# Patient Record
Sex: Female | Born: 1995 | Race: White | Hispanic: No | Marital: Single | Smoking: Never smoker
Health system: Southern US, Community
[De-identification: ages and names within clinical notes are randomized; demographics above are authoritative.]

## PROBLEM LIST (undated history)

## (undated) DIAGNOSIS — N946 Dysmenorrhea, unspecified: Secondary | ICD-10-CM

## (undated) DIAGNOSIS — R011 Cardiac murmur, unspecified: Secondary | ICD-10-CM

## (undated) DIAGNOSIS — N920 Excessive and frequent menstruation with regular cycle: Secondary | ICD-10-CM

## (undated) HISTORY — PX: TYMPANOSTOMY TUBE PLACEMENT: SHX32

## (undated) HISTORY — PX: OTHER SURGICAL HISTORY: SHX169

## (undated) HISTORY — DX: Dysmenorrhea, unspecified: N94.6

## (undated) HISTORY — DX: Cardiac murmur, unspecified: R01.1

## (undated) HISTORY — DX: Excessive and frequent menstruation with regular cycle: N92.0

---
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
## 2014-01-22 ENCOUNTER — Encounter: Payer: Self-pay | Admitting: Gynecology

## 2014-01-22 ENCOUNTER — Ambulatory Visit (INDEPENDENT_AMBULATORY_CARE_PROVIDER_SITE_OTHER): Payer: BC Managed Care – PPO | Admitting: Gynecology

## 2014-01-22 ENCOUNTER — Telehealth: Payer: Self-pay | Admitting: Obstetrics and Gynecology

## 2014-01-22 ENCOUNTER — Ambulatory Visit (INDEPENDENT_AMBULATORY_CARE_PROVIDER_SITE_OTHER): Payer: BC Managed Care – PPO

## 2014-01-22 VITALS — BP 120/70 | HR 76 | Resp 18 | Ht 65.0 in | Wt 137.0 lb

## 2014-01-22 DIAGNOSIS — R102 Pelvic and perineal pain: Secondary | ICD-10-CM

## 2014-01-22 DIAGNOSIS — R1032 Left lower quadrant pain: Secondary | ICD-10-CM

## 2014-01-22 DIAGNOSIS — Z975 Presence of (intrauterine) contraceptive device: Secondary | ICD-10-CM

## 2014-01-22 DIAGNOSIS — N949 Unspecified condition associated with female genital organs and menstrual cycle: Secondary | ICD-10-CM

## 2014-01-22 DIAGNOSIS — R52 Pain, unspecified: Secondary | ICD-10-CM

## 2014-01-22 NOTE — Telephone Encounter (Signed)
Spoke with patient's mom Marylene Landngela (okay per ROI). She states that patient is having severe pain and cramping that began this morning. Patient's LMP was last week. IUD placed 11/02/2013 by Dr.Silva. Patient states the pain feels like "knives stabbing me." Denies fever and discharge. Advised the need for office visit today for evaluation. Advised Dr.Silva out of the office today. Appointment scheduled with Dr.Lathrop at 12:00pm (time per provider). Mother agreeable to date and time.   Routing to provider for final review. Patient agreeable to disposition. Will close encounter  Routing to Dr.Lathrop as covering CC:Dr.Silva

## 2014-01-22 NOTE — Addendum Note (Signed)
Addended by: Michele McalpineHINES, Monicia Tse E on: 01/22/2014 12:26 PM   Modules accepted: Orders

## 2014-01-22 NOTE — Telephone Encounter (Signed)
Pt's mom Marylene Landngela calling for Patricia Leach she is having severe cramping and is away at college. Per mom she got an IUD last month and is wondering if this is normal to have so much cramping with an IUD. There is a signed DPR on file to speak with the mom.

## 2014-01-22 NOTE — Telephone Encounter (Signed)
Order entered for ultrasound for precert incase it is needed during OV. Sabrina notified.

## 2014-01-22 NOTE — Progress Notes (Signed)
Subjective:     Patient ID: Patricia Leach, female   DOB: 11/20/1995, 18 y.o.   MRN: [id number]  HPI Comments: Pt states that she had Skyla IUD placed 10/18/13.  She didi well until this am when she developed acute onset of LLQ pain that was sharp, shooting, made worse with change in activity.   Pt states that her cycles were irregular before IUD placed and still bleeds with IUD, mostly daily, brown-red.  No history of ovarian cysts  Pelvic Pain The patient's primary symptoms include pelvic pain. This is a new problem. The current episode started today. The problem has been resolved. The pain is severe. The problem affects the left side. She is not pregnant. Pertinent negatives include no back pain, chills, dysuria, flank pain, hematuria or vomiting. Exacerbated by: movement. She has tried NSAIDs (1000mg  motrin) for the symptoms. The treatment provided significant relief. She is not sexually active. She uses an IUD for contraception. Her menstrual history has been irregular.     Review of Systems  Constitutional: Negative for chills.  Gastrointestinal: Negative for vomiting.  Genitourinary: Positive for pelvic pain. Negative for dysuria, hematuria and flank pain.  Musculoskeletal: Negative for back pain.       Objective:   Physical Exam  Vitals reviewed. Constitutional: She is oriented to person, place, and time. She appears well-developed and well-nourished.  Neurological: She is alert and oriented to person, place, and time.   Pelvic: External genitalia:  no lesions              Urethra:  normal appearing urethra with no masses, tenderness or lesions              Bartholins and Skenes: normal                 Vagina: normal appearing vagina with normal color and discharge, no lesions              Cervix: normal appearance, IUD strings noted, deviated to right                      Bimanual Exam:  Uterus:  uterus is normal size, shape, consistency and nontender             Adnexa: normal adnexa in size, nontender and no masses                                           Assessment:     Acute LLQ pain, suspect ruptured ovarian cyst     Plan:     PUS today        Images reviewed with pt,  No evidence of ruptured cyst or torsion.  Pt instructed to call if symptoms recur. Pt assured Note given for school

## 2014-06-18 ENCOUNTER — Telehealth: Payer: Self-pay | Admitting: Gynecology

## 2014-06-18 NOTE — Telephone Encounter (Signed)
Spoke with patient. Advised spoke with Dr.Lathrop. Dr.Lathrop would like patient to come in to be seen for consult on Friday 10/16 to discuss symptoms and IUD removal and go from there. Patient is agreeable and verbalizes understanding. Appointment scheduled for 10/16 at 8:30am with Dr.Lathrop (time okay per provider). Agreeable to date and time.  Routing to provider for final review. Patient agreeable to disposition. Will close encounter

## 2014-06-18 NOTE — Telephone Encounter (Signed)
Pt called during lunch stated she was having side effects from skyla and wanted to get it removed and talk about other options. Said she would be in town next week.

## 2014-06-18 NOTE — Telephone Encounter (Addendum)
Tried to reach patient at number provided. There was no answer and the voicemail box is currently full. Will try again later.

## 2014-06-28 ENCOUNTER — Ambulatory Visit (INDEPENDENT_AMBULATORY_CARE_PROVIDER_SITE_OTHER): Payer: BC Managed Care – PPO | Admitting: Gynecology

## 2014-06-28 ENCOUNTER — Encounter: Payer: Self-pay | Admitting: Gynecology

## 2014-06-28 VITALS — BP 118/84 | Resp 14 | Ht 65.0 in | Wt 143.0 lb

## 2014-06-28 DIAGNOSIS — Z975 Presence of (intrauterine) contraceptive device: Secondary | ICD-10-CM

## 2014-06-28 DIAGNOSIS — Z30432 Encounter for removal of intrauterine contraceptive device: Secondary | ICD-10-CM

## 2014-06-28 DIAGNOSIS — Z30011 Encounter for initial prescription of contraceptive pills: Secondary | ICD-10-CM

## 2014-06-28 MED ORDER — LEVONORGESTREL-ETHINYL ESTRAD 0.15-30 MG-MCG PO TABS: 1.0000 | ORAL_TABLET | Freq: Every day | ORAL | Status: DC

## 2014-06-28 NOTE — Progress Notes (Signed)
Pt here to discuss alternative contraceptive options. Pt has skyla IUD placed in 10/2013 and reports since then she has had 2w/m of vaginal bleeding-10-12d light flow with 2d of brown spotting.  Bleeding is painless but bothersome.  Pt used IUD because she was not interested in daily Rx.  Pt has used nuvaring in the past and is not interested in trying again. Pt reports last coitus was in September, and she has had a cycle since then.  Pt is not in a relationship at this time. Pt would like to have IUD removed today as she is away at college in [place]. Pt reports using condoms with every contact. Contraceptive options reviewed including nexlanon, ocp, nuvaring, patch.  Modes of active, anticipated side effects and benefits.  Reviewed usual bleeding profile on iud as well.  Pt would still like to have removed and is interested in starting ocp.   LMP 06/13/14 As she is midcycle, recommend starting ocp the first day of flow, continued condom use.  Pt will defer from sexual activity until on ocp.  Questions addressed.  BP 118/84  Resp 14  Ht 5\' 5"  (1.651 m)  Wt 143 lb (64.864 kg)  BMI 23.80 kg/m2  LMP 06/13/2014  Pelvic: External genitalia:  no lesions              Urethra:  normal appearing urethra with no masses, tenderness or lesions              Bartholins and Skenes: normal                 Vagina: normal appearing vagina with normal color and discharge, no lesions              Cervix: normal appearance, IUD strings noted, no CMT                      Bimanual Exam:  Uterus:  uterus is normal size, shape, consistency and nontender                                      Adnexa: normal adnexa in size, nontender and no masses                                       Procedure: ID strings noted, grasped and IUD removed with ease, shown to pt Tolerated well  764m spent counseling >50% face to face

## 2014-06-28 NOTE — Patient Instructions (Addendum)
Start ocp first day of flow, continue to use condoms

## 2014-07-01 ENCOUNTER — Telehealth: Payer: Self-pay | Admitting: *Deleted

## 2014-07-01 ENCOUNTER — Telehealth: Payer: Self-pay | Admitting: Gynecology

## 2014-07-01 NOTE — Telephone Encounter (Signed)
Pt was midcycle when IUD was removed, bleeding can happen with loss of progestin.  Ok to watch for now and remind her to start ocp with the first day of her cycle, I do not believe this is her cycle, LMP 06/13/14 per chart

## 2014-07-01 NOTE — Telephone Encounter (Signed)
Pt had IUD removed and is now. Pt states she bleeding really heavy.

## 2014-07-01 NOTE — Telephone Encounter (Signed)
Left Message To Call Back Re: Scheduling AEX patient had AEX 12/07/13 but was not examined due to emergency with the provider.

## 2014-07-01 NOTE — Telephone Encounter (Signed)
Left message to call Aubryana Vittorio at [phone]. 

## 2014-07-01 NOTE — Telephone Encounter (Addendum)
Spoke with patient at time of incoming call. Patient states that she had IUD removed on Friday 10/16. Patient's LMP ended on 10/11. Patient started bleeding over the weekend. Is having to change tampon every 4-6 hours. "It is just heavier than normal." Advised patient that with nexplanon removal she will have some irregular bleeding as her body readjusts to not having the nexplanon. Advised this is not uncommon. Advised if bleeding increases to having to change pad/tampon every 1-2 hours due to bleeding through will need to call to be seen or seek immediate care. Advised if bleeding is prolonged will also need to call to be seen. Patient is agreeable and verbalizes understanding. Patient denies any pain or dizziness. Will monitor bleeding and call back with any symptoms changes.  Dr.Lathrop, do you agree with recommendations?

## 2014-07-03 NOTE — Telephone Encounter (Signed)
S/w patient and scheduled her for 08/07/14 at 9:00 with Dr.Lathrop.  Routed to provider for review, encounter closed.

## 2014-07-04 NOTE — Telephone Encounter (Signed)
Left message to call Kaitlyn at [phone]. 

## 2014-07-04 NOTE — Telephone Encounter (Signed)
Spoke with patient. Advised patient of message as seen below from Dr.Lathrop. Patient is agreeable and states "The bleeding actually stopped so no more worries." Patient will start OCP on first day of next cycle.  Routing to provider for final review. Patient agreeable to disposition. Will close encounter

## 2014-08-07 ENCOUNTER — Encounter: Payer: Self-pay | Admitting: Certified Nurse Midwife

## 2014-08-07 ENCOUNTER — Ambulatory Visit: Payer: BC Managed Care – PPO | Admitting: Certified Nurse Midwife

## 2014-08-07 ENCOUNTER — Ambulatory Visit: Payer: BC Managed Care – PPO | Admitting: Gynecology

## 2014-08-23 ENCOUNTER — Telehealth: Payer: Self-pay | Admitting: Certified Nurse Midwife

## 2014-08-23 DIAGNOSIS — Z30011 Encounter for initial prescription of contraceptive pills: Secondary | ICD-10-CM

## 2014-08-23 MED ORDER — LEVONORGESTREL-ETHINYL ESTRAD 0.15-30 MG-MCG PO TABS: 1.0000 | ORAL_TABLET | Freq: Every day | ORAL | Status: DC

## 2014-08-23 NOTE — Telephone Encounter (Signed)
OK to fill Nordette for one month.

## 2014-08-23 NOTE — Telephone Encounter (Signed)
PT is in [place] on vacation and has left her b/c home. She is [facility] [address] [phone]

## 2014-08-23 NOTE — Telephone Encounter (Signed)
Nordette #1 pack with no refills sent to [facility] in [place], LM on patient's voicemail that rx has been sent.  Routed to provider for review, encounter closed.

## 2014-08-23 NOTE — Telephone Encounter (Signed)
Medication refill request: Nordette Last AEX: 12/07/13 with Dr. Edward JollySilva Next AEX: No AEX scheduled for 2016 Last MMG (if hormonal medication request): N/A Refill authorized: #1 pack, please advise.

## 2014-09-19 ENCOUNTER — Encounter: Payer: Self-pay | Admitting: Nurse Practitioner

## 2014-09-19 ENCOUNTER — Ambulatory Visit (INDEPENDENT_AMBULATORY_CARE_PROVIDER_SITE_OTHER): Payer: BLUE CROSS/BLUE SHIELD | Admitting: Nurse Practitioner

## 2014-09-19 VITALS — BP 120/72 | HR 64 | Ht 65.5 in | Wt 146.0 lb

## 2014-09-19 DIAGNOSIS — Z01419 Encounter for gynecological examination (general) (routine) without abnormal findings: Secondary | ICD-10-CM

## 2014-09-19 DIAGNOSIS — Z0000 Encounter for general adult medical examination without abnormal findings: Secondary | ICD-10-CM

## 2014-09-19 DIAGNOSIS — Z113 Encounter for screening for infections with a predominantly sexual mode of transmission: Secondary | ICD-10-CM

## 2014-09-19 LAB — STD PANEL
HIV: NONREACTIVE
Hepatitis B Surface Ag: NEGATIVE

## 2014-09-19 LAB — POCT URINALYSIS DIPSTICK
Bilirubin, UA: NEGATIVE
GLUCOSE UA: NEGATIVE
Ketones, UA: NEGATIVE
LEUKOCYTES UA: NEGATIVE
NITRITE UA: NEGATIVE
PH UA: 5
PROTEIN UA: NEGATIVE
RBC UA: NEGATIVE
Urobilinogen, UA: NEGATIVE

## 2014-09-19 NOTE — Patient Instructions (Signed)
General topics  Next pap or exam is  due in 1 year Take a Women's multivitamin Take 1200 mg. of calcium daily - prefer dietary If any concerns in interim to call back  Breast Self-Awareness Practicing breast self-awareness may pick up problems early, prevent significant medical complications, and possibly save your life. By practicing breast self-awareness, you can become familiar with how your breasts look and feel and if your breasts are changing. This allows you to notice changes early. It can also offer you some reassurance that your breast health is good. One way to learn what is normal for your breasts and whether your breasts are changing is to do a breast self-exam. If you find a lump or something that was not present in the past, it is best to contact your caregiver right away. Other findings that should be evaluated by your caregiver include nipple discharge, especially if it is bloody; skin changes or reddening; areas where the skin seems to be pulled in (retracted); or new lumps and bumps. Breast pain is seldom associated with cancer (malignancy), but should also be evaluated by a caregiver. BREAST SELF-EXAM The best time to examine your breasts is 5 7 days after your menstrual period is over.  ExitCare Patient Information 2013 [employer].   Exercise to Stay Healthy Exercise helps you become and stay healthy. EXERCISE IDEAS AND TIPS Choose exercises that:  You enjoy.  Fit into your day. You do not need to exercise really hard to be healthy. You can do exercises at a slow or medium level and stay healthy. You can:  Stretch before and after working out.  Try yoga, Pilates, or tai chi.  Lift weights.  Walk fast, swim, jog, run, climb stairs, bicycle, dance, or rollerskate.  Take aerobic classes. Exercises that burn about 150 calories:  Running 1  miles in 15 minutes.  Playing volleyball for 45 to 60 minutes.  Washing and waxing a car for 45 to 60  minutes.  Playing touch football for 45 minutes.  Walking 1  miles in 35 minutes.  Pushing a stroller 1  miles in 30 minutes.  Playing basketball for 30 minutes.  Raking leaves for 30 minutes.  Bicycling 5 miles in 30 minutes.  Walking 2 miles in 30 minutes.  Dancing for 30 minutes.  Shoveling snow for 15 minutes.  Swimming laps for 20 minutes.  Walking up stairs for 15 minutes.  Bicycling 4 miles in 15 minutes.  Gardening for 30 to 45 minutes.  Jumping rope for 15 minutes.  Washing windows or floors for 45 to 60 minutes. Document Released: 10/02/2010 Document Revised: 11/22/2011 Document Reviewed: 10/02/2010 ExitCare Patient Information 2013 [employer].   Other topics ( that may be useful information):    Sexually Transmitted Disease Sexually transmitted disease (STD) refers to any infection that is passed from person to person during sexual activity. This may happen by way of saliva, semen, blood, vaginal mucus, or urine. Common STDs include:  Gonorrhea.  Chlamydia.  Syphilis.  HIV/AIDS.  Genital herpes.  Hepatitis B and C.  Trichomonas.  Human papillomavirus (HPV).  Pubic lice. CAUSES  An STD may be spread by bacteria, virus, or parasite. A person can get an STD by:  Sexual intercourse with an infected person.  Sharing sex toys with an infected person.  Sharing needles with an infected person.  Having intimate contact with the genitals, mouth, or rectal areas of an infected person. SYMPTOMS  Some people may not have any symptoms, but   they can still pass the infection to others. Different STDs have different symptoms. Symptoms include:  Painful or bloody urination.  Pain in the pelvis, abdomen, vagina, anus, throat, or eyes.  Skin rash, itching, irritation, growths, or sores (lesions). These usually occur in the genital or anal area.  Abnormal vaginal discharge.  Penile discharge in men.  Soft, flesh-colored skin growths in the  genital or anal area.  Fever.  Pain or bleeding during sexual intercourse.  Swollen glands in the groin area.  Yellow skin and eyes (jaundice). This is seen with hepatitis. DIAGNOSIS  To make a diagnosis, your caregiver may:  Take a medical history.  Perform a physical exam.  Take a specimen (culture) to be examined.  Examine a sample of discharge under a microscope.  Perform blood test TREATMENT   Chlamydia, gonorrhea, trichomonas, and syphilis can be cured with antibiotic medicine.  Genital herpes, hepatitis, and HIV can be treated, but not cured, with prescribed medicines. The medicines will lessen the symptoms.  Genital warts from HPV can be treated with medicine or by freezing, burning (electrocautery), or surgery. Warts may come back.  HPV is a virus and cannot be cured with medicine or surgery.However, abnormal areas may be followed very closely by your caregiver and may be removed from the cervix, vagina, or vulva through office procedures or surgery. If your diagnosis is confirmed, your recent sexual partners need treatment. This is true even if they are symptom-free or have a negative culture or evaluation. They should not have sex until their caregiver says it is okay. HOME CARE INSTRUCTIONS  All sexual partners should be informed, tested, and treated for all STDs.  Take your antibiotics as directed. Finish them even if you start to feel better.  Only take over-the-counter or prescription medicines for pain, discomfort, or fever as directed by your caregiver.  Rest.  Eat a balanced diet and drink enough fluids to keep your urine clear or pale yellow.  Do not have sex until treatment is completed and you have followed up with your caregiver. STDs should be checked after treatment.  Keep all follow-up appointments, Pap tests, and blood tests as directed by your caregiver.  Only use latex condoms and water-soluble lubricants during sexual activity. Do not use  petroleum jelly or oils.  Avoid alcohol and illegal drugs.  Get vaccinated for HPV and hepatitis. If you have not received these vaccines in the past, talk to your caregiver about whether one or both might be right for you.  Avoid risky sex practices that can break the skin. The only way to avoid getting an STD is to avoid all sexual activity.Latex condoms and dental dams (for oral sex) will help lessen the risk of getting an STD, but will not completely eliminate the risk. SEEK MEDICAL CARE IF:   You have a fever.  You have any new or worsening symptoms. Document Released: 11/20/2002 Document Revised: 11/22/2011 Document Reviewed: 11/27/2010 [facility] Patient Information 2013 [employer].    Domestic Abuse You are being battered or abused if someone close to you hits, pushes, or physically hurts you in any way. You also are being abused if you are forced into activities. You are being sexually abused if you are forced to have sexual contact of any kind. You are being emotionally abused if you are made to feel worthless or if you are constantly threatened. It is important to remember that help is available. No one has the right to abuse you. PREVENTION OF FURTHER  ABUSE  Learn the warning signs of danger. This varies with situations but may include: the use of alcohol, threats, isolation from friends and family, or forced sexual contact. Leave if you feel that violence is going to occur.  If you are attacked or beaten, report it to the police so the abuse is documented. You do not have to press charges. The police can protect you while you or the attackers are leaving. Get the officer's name and badge number and a copy of the report.  Find someone you can trust and tell them what is happening to you: your caregiver, a nurse, clergy member, close friend or family member. Feeling ashamed is natural, but remember that you have done nothing wrong. No one deserves abuse. Document Released:  08/27/2000 Document Revised: 11/22/2011 Document Reviewed: 11/05/2010 ExitCare Patient Information 2013 [employer].    How Much is Too Much Alcohol? Drinking too much alcohol can cause injury, accidents, and health problems. These types of problems can include:   Car crashes.  Falls.  Family fighting (domestic violence).  Drowning.  Fights.  Injuries.  Burns.  Damage to certain organs.  Having a baby with birth defects. ONE DRINK CAN BE TOO MUCH WHEN YOU ARE:  Working.  Pregnant or breastfeeding.  Taking medicines. Ask your doctor.  Driving or planning to drive. If you or someone you know has a drinking problem, get help from a doctor.  Document Released: 06/26/2009 Document Revised: 11/22/2011 Document Reviewed: 06/26/2009 ExitCare Patient Information 2013 [employer].   Smoking Hazards Smoking cigarettes is extremely bad for your health. Tobacco smoke has over 200 known poisons in it. There are over 60 chemicals in tobacco smoke that cause cancer. Some of the chemicals found in cigarette smoke include:   Cyanide.  Benzene.  Formaldehyde.  Methanol (wood alcohol).  Acetylene (fuel used in welding torches).  Ammonia. Cigarette smoke also contains the poisonous gases nitrogen oxide and carbon monoxide.  Cigarette smokers have an increased risk of many serious medical problems and Smoking causes approximately:  90% of all lung cancer deaths in men.  80% of all lung cancer deaths in women.  90% of deaths from chronic obstructive lung disease. Compared with nonsmokers, smoking increases the risk of:  Coronary heart disease by 2 to 4 times.  Stroke by 2 to 4 times.  Men developing lung cancer by 23 times.  Women developing lung cancer by 13 times.  Dying from chronic obstructive lung diseases by 12 times.  . Smoking is the most preventable cause of death and disease in our society.  WHY IS SMOKING ADDICTIVE?  Nicotine is the chemical  agent in tobacco that is capable of causing addiction or dependence.  When you smoke and inhale, nicotine is absorbed rapidly into the bloodstream through your lungs. Nicotine absorbed through the lungs is capable of creating a powerful addiction. Both inhaled and non-inhaled nicotine may be addictive.  Addiction studies of cigarettes and spit tobacco show that addiction to nicotine occurs mainly during the teen years, when young people begin using tobacco products. WHAT ARE THE BENEFITS OF QUITTING?  There are many health benefits to quitting smoking.   Likelihood of developing cancer and heart disease decreases. Health improvements are seen almost immediately.  Blood pressure, pulse rate, and breathing patterns start returning to normal soon after quitting. QUITTING SMOKING   American Lung Association - 1-800-LUNGUSA  American Cancer Society - 1-800-ACS-2345 Document Released: 10/07/2004 Document Revised: 11/22/2011 Document Reviewed: 06/11/2009 ExitCare Patient Information 2013 ExitCare,   LLC.   Stress Management Stress is a state of physical or mental tension that often results from changes in your life or normal routine. Some common causes of stress are:  Death of a loved one.  Injuries or severe illnesses.  Getting fired or changing jobs.  Moving into a new home. Other causes may be:  Sexual problems.  Business or financial losses.  Taking on a large debt.  Regular conflict with someone at home or at work.  Constant tiredness from lack of sleep. It is not just bad things that are stressful. It may be stressful to:  Win the lottery.  Get married.  Buy a new car. The amount of stress that can be easily tolerated varies from person to person. Changes generally cause stress, regardless of the types of change. Too much stress can affect your health. It may lead to physical or emotional problems. Too little stress (boredom) may also become stressful. SUGGESTIONS TO  REDUCE STRESS:  Talk things over with your family and friends. It often is helpful to share your concerns and worries. If you feel your problem is serious, you may want to get help from a professional counselor.  Consider your problems one at a time instead of lumping them all together. Trying to take care of everything at once may seem impossible. List all the things you need to do and then start with the most important one. Set a goal to accomplish 2 or 3 things each day. If you expect to do too many in a single day you will naturally fail, causing you to feel even more stressed.  Do not use alcohol or drugs to relieve stress. Although you may feel better for a short time, they do not remove the problems that caused the stress. They can also be habit forming.  Exercise regularly - at least 3 times per week. Physical exercise can help to relieve that "uptight" feeling and will relax you.  The shortest distance between despair and hope is often a good night's sleep.  Go to bed and get up on time allowing yourself time for appointments without being rushed.  Take a short "time-out" period from any stressful situation that occurs during the day. Close your eyes and take some deep breaths. Starting with the muscles in your face, tense them, hold it for a few seconds, then relax. Repeat this with the muscles in your neck, shoulders, hand, stomach, back and legs.  Take good care of yourself. Eat a balanced diet and get plenty of rest.  Schedule time for having fun. Take a break from your daily routine to relax. HOME CARE INSTRUCTIONS   Call if you feel overwhelmed by your problems and feel you can no longer manage them on your own.  Return immediately if you feel like hurting yourself or someone else. Document Released: 02/23/2001 Document Revised: 11/22/2011 Document Reviewed: 10/16/2007 ExitCare Patient Information 2013 ExitCare, LLC.  

## 2014-09-19 NOTE — Progress Notes (Signed)
Patient ID: Patricia Leach, female   DOB: August 10, 1996, 19 y.o.   MRN: [id number] 19 y.o. G0P0 Single Caucasian Fe here for annual exam.  Skyla IUD insertion in 10/2013 and removed 01/22/14 secondary to prolonged menses. Back on OCP- Nordette first of June. Compliance is better since schedule is more regular. Last SA - currently, but no relationship.  Patient's last menstrual period was 09/05/2014.          Sexually active: Yes.    The current method of family planning is OCP (estrogen/progesterone) and condoms all the time.    Exercising: Yes.    yoga, running and weights 4-5 times per week Smoker:  no  Health Maintenance: Pap:  Never  TDaP:  2005 Gardasil:  Completed 2013 Labs:  HB:  13.6  Urine:  Negative    reports that she has never smoked. She has never used smokeless tobacco. She reports that she drinks about 3.6 oz of alcohol per week. She reports that she uses illicit drugs.  Past Medical History  Diagnosis Date  . Menorrhagia   . Dysmenorrhea   . Heart murmur of newborn     Past Surgical History  Procedure Laterality Date  . Tympanostomy tube placement    . Growth removed Right     growth removed from right ear  . Left ankle      shark attack at age 29  . Intrauterine device (iud) insertion  11/02/13    Skyla    Current Outpatient Prescriptions  Medication Sig Dispense Refill  . DRYSOL 20 % external solution Apply under arms as directed  6  . levonorgestrel-ethinyl estradiol (NORDETTE) 0.15-30 MG-MCG tablet Take 1 tablet by mouth daily. 1 Package 0   No current facility-administered medications for this visit.    Family History  Problem Relation Age of Onset  . Adopted: Yes  . Family history unknown: Yes    ROS:  Pertinent items are noted in HPI.  Otherwise, a comprehensive ROS was negative.  Exam:   BP 120/72 mmHg  Pulse 64  Ht 5' 5.5" (1.664 m)  Wt 146 lb (66.225 kg)  BMI 23.92 kg/m2  LMP 09/05/2014 Height: 5' 5.5" (166.4 cm)  Ht Readings from Last 3  Encounters:  09/19/14 5' 5.5" (1.664 m) (68 %*, Z = 0.48)  06/28/14  (1.651 m) (61 %*, Z = 0.28)  01/22/14  (1.651 m) (61 %*, Z = 0.29)   * Growth percentiles are based on CDC 2-20 Years data.    General appearance: alert, cooperative and appears stated age Head: Normocephalic, without obvious abnormality, atraumatic Neck: no adenopathy, supple, symmetrical, trachea midline and thyroid normal to inspection and palpation Lungs: clear to auscultation bilaterally Breasts: normal appearance, no masses or tenderness Heart: regular rate and rhythm Abdomen: soft, non-tender; no masses,  no organomegaly Extremities: extremities normal, atraumatic, no cyanosis or edema Skin: Skin color, texture, turgor normal. No rashes or lesions Lymph nodes: Cervical, supraclavicular, and axillary nodes normal. No abnormal inguinal nodes palpated Neurologic: Grossly normal   Pelvic: External genitalia:  no lesions              Urethra:  normal appearing urethra with no masses, tenderness or lesions              Bartholin's and Skene's: normal                 Vagina: normal appearing vagina with normal color and discharge, no lesions  Cervix: anteverted              Pap taken: No. Bimanual Exam:  Uterus:  normal size, contour, position, consistency, mobility, non-tender              Adnexa: no mass, fullness, tenderness               Rectovaginal: Confirms               Anus:  normal sphincter tone, no lesions  A:  Well Woman with normal exam  Contraception with OCP  R/O STD's  P:   Reviewed health and wellness pertinent to exam  Pap smear not taken today  Refill Nordette for a year  Counseled on breast self exam, STD prevention, HIV risk factors and prevention, use and side effects of OCP's, adequate intake of calcium and vitamin D, diet and exercise return annually or prn  An After Visit Summary was printed and given to the patient.

## 2014-09-20 LAB — HEMOGLOBIN, FINGERSTICK: HEMOGLOBIN, FINGERSTICK: 13.6 g/dL (ref 12.0–16.0)

## 2014-09-21 LAB — IPS N GONORRHOEA AND CHLAMYDIA BY PCR

## 2014-09-22 NOTE — Progress Notes (Signed)
Encounter reviewed by Dr. Deangela Randleman Silva.  

## 2014-10-17 ENCOUNTER — Other Ambulatory Visit: Payer: Self-pay | Admitting: Obstetrics and Gynecology

## 2014-10-18 NOTE — Telephone Encounter (Signed)
Medication refill request: Levora. Patient is on Nordette  Last AEX:  09/19/14 PG Next AEX: None Last MMG (if hormonal medication request): None Refill authorized: 08/23/14 #1pack/0R. Today #1pack/11R?

## 2014-11-20 ENCOUNTER — Telehealth: Payer: Self-pay | Admitting: Nurse Practitioner

## 2014-11-20 NOTE — Telephone Encounter (Signed)
Patient calling with questions about her birth control for the nurse.

## 2014-11-20 NOTE — Telephone Encounter (Signed)
Spoke with patient. She is taking Levora 0.15/30. Has been using this pill since 06/28/14.   Patient states she was trying to manipulate her cycles and not have a period and skipped third week of active pills and took placebo pills instead, she started having a cycle while taking the placebo pills, and is now taking the third week of active pills. She will start a new pack of pills this Sunday.  Reviewed with Lauro FranklinPatricia Rolen-Grubb, FNP and returned call to patient.   Advised patient if does not take active pills in third week, she is is not covered for contraception. Patient states she was not sexually active at all while taking placebo pills. Patient is advised to continue taking active white pills as directed and if comes to placebo pills in her next pack and does not start a cycle, should take a pregnancy test and call our office. Advised patient to use back up method for birth control for one month.  Patient is given directions if in the future would like miss a period all she will need to do is skip placebo pills and take continuos white active pills in her pack. If needs refills of ocp, to call our office.   Okay as directed?

## 2014-11-22 NOTE — Telephone Encounter (Signed)
Reviewed agreeable with plan

## 2015-08-28 ENCOUNTER — Telehealth: Payer: Self-pay | Admitting: Nurse Practitioner

## 2015-08-28 NOTE — Telephone Encounter (Signed)
Patient called she is wanting to come in to see Ms. Patty because she is having side effects with her BC. I scheduled patient for 09/09/15 at 2:45 due to her needing to come in the week of Christmas. Best # to reach: [phone] for any questions.

## 2015-08-29 NOTE — Telephone Encounter (Signed)
Patient scheduled by reception for office visit at her convenience.  Okay to close?

## 2015-08-29 NOTE — Telephone Encounter (Signed)
Yes.  OK to close encounter. 

## 2015-09-09 ENCOUNTER — Encounter: Payer: Self-pay | Admitting: Nurse Practitioner

## 2015-09-09 ENCOUNTER — Ambulatory Visit (INDEPENDENT_AMBULATORY_CARE_PROVIDER_SITE_OTHER): Payer: BLUE CROSS/BLUE SHIELD | Admitting: Nurse Practitioner

## 2015-09-09 VITALS — BP 110/74 | HR 76 | Ht 65.5 in | Wt 136.0 lb

## 2015-09-09 DIAGNOSIS — L709 Acne, unspecified: Secondary | ICD-10-CM | POA: Diagnosis not present

## 2015-09-09 DIAGNOSIS — Z3009 Encounter for other general counseling and advice on contraception: Secondary | ICD-10-CM

## 2015-09-09 MED ORDER — DROSPIRENONE-ETHINYL ESTRADIOL 3-0.02 MG PO TABS
1.0000 | ORAL_TABLET | Freq: Every day | ORAL | Status: DC
Start: 2015-09-09 — End: 2015-12-18

## 2015-09-09 NOTE — Patient Instructions (Signed)
When you return for annual exam will then refill new RX.

## 2015-09-09 NOTE — Progress Notes (Signed)
19 y.o. Single Caucasian female G0P0 here for follow up of OCP.  She has been on Levora since about March and likes the pill.  She has now been put on Spironolactone for acne along with the OCP.  She now complains that libido is way down and wants to find the cause.  The dermatologist says not related to Spironolactone and to come and see us.  She is still away at school which has been stressful.  She is also working. part time. She recognizes that stress could be a part of this.  But would like to change her pills.    O: Healthy WD,WN female Affect: normal Skin: acne does seem improved   A: Acne - better on combination OCP and Spironolactone  Decreased libido  P:  Discussed the effects of OCP on libido.  Can try Yaz for the next 3 months and see if this is helpful  At that time will have spring break and return for AEX  Labs: none  Instructions given regarding: effects and risk of OCP  RV  Consult time is 15 minutes.

## 2015-09-11 NOTE — Progress Notes (Signed)
Encounter reviewed Jill Jertson, MD   

## 2015-12-18 ENCOUNTER — Other Ambulatory Visit: Payer: Self-pay | Admitting: Nurse Practitioner

## 2015-12-18 NOTE — Telephone Encounter (Signed)
Medication refill request: Patricia FiddlerVestura Last AEX:  09/19/2014 with PG  Next AEX: 01/23/16 (only time patient able to come in due to school) Last MMG (if hormonal medication request): n/a Refill authorized: #[id number]  Got a [facility] from patient [facility]  [facility]  Routed to Ms. Debbie since Ms. Alexia Freestoneatty is out of office today.

## 2015-12-18 NOTE — Telephone Encounter (Signed)
Will refill until aex 

## 2016-01-23 ENCOUNTER — Encounter: Payer: Self-pay | Admitting: Nurse Practitioner

## 2016-01-23 ENCOUNTER — Ambulatory Visit (INDEPENDENT_AMBULATORY_CARE_PROVIDER_SITE_OTHER): Payer: BLUE CROSS/BLUE SHIELD | Admitting: Nurse Practitioner

## 2016-01-23 VITALS — BP 100/60 | HR 68 | Ht 65.0 in | Wt 136.0 lb

## 2016-01-23 DIAGNOSIS — Z0000 Encounter for general adult medical examination without abnormal findings: Secondary | ICD-10-CM | POA: Diagnosis not present

## 2016-01-23 DIAGNOSIS — N912 Amenorrhea, unspecified: Secondary | ICD-10-CM

## 2016-01-23 DIAGNOSIS — Z01419 Encounter for gynecological examination (general) (routine) without abnormal findings: Secondary | ICD-10-CM

## 2016-01-23 DIAGNOSIS — L709 Acne, unspecified: Secondary | ICD-10-CM | POA: Diagnosis not present

## 2016-01-23 LAB — POCT URINE PREGNANCY: Preg Test, Ur: NEGATIVE

## 2016-01-23 LAB — POCT URINALYSIS DIPSTICK
BILIRUBIN UA: NEGATIVE
Blood, UA: NEGATIVE
Glucose, UA: NEGATIVE
KETONES UA: NEGATIVE
LEUKOCYTES UA: NEGATIVE
Nitrite, UA: NEGATIVE
Protein, UA: NEGATIVE
Urobilinogen, UA: NEGATIVE
pH, UA: 5

## 2016-01-23 MED ORDER — DROSPIRENONE-ETHINYL ESTRADIOL 3-0.02 MG PO TABS: 1.0000 | ORAL_TABLET | Freq: Every day | ORAL | Status: DC

## 2016-01-23 NOTE — Patient Instructions (Signed)
General topics  Next pap or exam is  due in 1 year Take a Women's multivitamin Take 1200 mg. of calcium daily - prefer dietary If any concerns in interim to call back  Breast Self-Awareness Practicing breast self-awareness may pick up problems early, prevent significant medical complications, and possibly save your life. By practicing breast self-awareness, you can become familiar with how your breasts look and feel and if your breasts are changing. This allows you to notice changes early. It can also offer you some reassurance that your breast health is good. One way to learn what is normal for your breasts and whether your breasts are changing is to do a breast self-exam. If you find a lump or something that was not present in the past, it is best to contact your caregiver right away. Other findings that should be evaluated by your caregiver include nipple discharge, especially if it is bloody; skin changes or reddening; areas where the skin seems to be pulled in (retracted); or new lumps and bumps. Breast pain is seldom associated with cancer (malignancy), but should also be evaluated by a caregiver. BREAST SELF-EXAM The best time to examine your breasts is 5 7 days after your menstrual period is over.  ExitCare Patient Information 2013 [employer].   Exercise to Stay Healthy Exercise helps you become and stay healthy. EXERCISE IDEAS AND TIPS Choose exercises that:  You enjoy.  Fit into your day. You do not need to exercise really hard to be healthy. You can do exercises at a slow or medium level and stay healthy. You can:  Stretch before and after working out.  Try yoga, Pilates, or tai chi.  Lift weights.  Walk fast, swim, jog, run, climb stairs, bicycle, dance, or rollerskate.  Take aerobic classes. Exercises that burn about 150 calories:  Running 1  miles in 15 minutes.  Playing volleyball for 45 to 60 minutes.  Washing and waxing a car for 45 to 60  minutes.  Playing touch football for 45 minutes.  Walking 1  miles in 35 minutes.  Pushing a stroller 1  miles in 30 minutes.  Playing basketball for 30 minutes.  Raking leaves for 30 minutes.  Bicycling 5 miles in 30 minutes.  Walking 2 miles in 30 minutes.  Dancing for 30 minutes.  Shoveling snow for 15 minutes.  Swimming laps for 20 minutes.  Walking up stairs for 15 minutes.  Bicycling 4 miles in 15 minutes.  Gardening for 30 to 45 minutes.  Jumping rope for 15 minutes.  Washing windows or floors for 45 to 60 minutes. Document Released: 10/02/2010 Document Revised: 11/22/2011 Document Reviewed: 10/02/2010 ExitCare Patient Information 2013 [employer].   Other topics ( that may be useful information):    Sexually Transmitted Disease Sexually transmitted disease (STD) refers to any infection that is passed from person to person during sexual activity. This may happen by way of saliva, semen, blood, vaginal mucus, or urine. Common STDs include:  Gonorrhea.  Chlamydia.  Syphilis.  HIV/AIDS.  Genital herpes.  Hepatitis B and C.  Trichomonas.  Human papillomavirus (HPV).  Pubic lice. CAUSES  An STD may be spread by bacteria, virus, or parasite. A person can get an STD by:  Sexual intercourse with an infected person.  Sharing sex toys with an infected person.  Sharing needles with an infected person.  Having intimate contact with the genitals, mouth, or rectal areas of an infected person. SYMPTOMS  Some people may not have any symptoms, but   they can still pass the infection to others. Different STDs have different symptoms. Symptoms include:  Painful or bloody urination.  Pain in the pelvis, abdomen, vagina, anus, throat, or eyes.  Skin rash, itching, irritation, growths, or sores (lesions). These usually occur in the genital or anal area.  Abnormal vaginal discharge.  Penile discharge in men.  Soft, flesh-colored skin growths in the  genital or anal area.  Fever.  Pain or bleeding during sexual intercourse.  Swollen glands in the groin area.  Yellow skin and eyes (jaundice). This is seen with hepatitis. DIAGNOSIS  To make a diagnosis, your caregiver may:  Take a medical history.  Perform a physical exam.  Take a specimen (culture) to be examined.  Examine a sample of discharge under a microscope.  Perform blood test TREATMENT   Chlamydia, gonorrhea, trichomonas, and syphilis can be cured with antibiotic medicine.  Genital herpes, hepatitis, and HIV can be treated, but not cured, with prescribed medicines. The medicines will lessen the symptoms.  Genital warts from HPV can be treated with medicine or by freezing, burning (electrocautery), or surgery. Warts may come back.  HPV is a virus and cannot be cured with medicine or surgery.However, abnormal areas may be followed very closely by your caregiver and may be removed from the cervix, vagina, or vulva through office procedures or surgery. If your diagnosis is confirmed, your recent sexual partners need treatment. This is true even if they are symptom-free or have a negative culture or evaluation. They should not have sex until their caregiver says it is okay. HOME CARE INSTRUCTIONS  All sexual partners should be informed, tested, and treated for all STDs.  Take your antibiotics as directed. Finish them even if you start to feel better.  Only take over-the-counter or prescription medicines for pain, discomfort, or fever as directed by your caregiver.  Rest.  Eat a balanced diet and drink enough fluids to keep your urine clear or pale yellow.  Do not have sex until treatment is completed and you have followed up with your caregiver. STDs should be checked after treatment.  Keep all follow-up appointments, Pap tests, and blood tests as directed by your caregiver.  Only use latex condoms and water-soluble lubricants during sexual activity. Do not use  petroleum jelly or oils.  Avoid alcohol and illegal drugs.  Get vaccinated for HPV and hepatitis. If you have not received these vaccines in the past, talk to your caregiver about whether one or both might be right for you.  Avoid risky sex practices that can break the skin. The only way to avoid getting an STD is to avoid all sexual activity.Latex condoms and dental dams (for oral sex) will help lessen the risk of getting an STD, but will not completely eliminate the risk. SEEK MEDICAL CARE IF:   You have a fever.  You have any new or worsening symptoms. Document Released: 11/20/2002 Document Revised: 11/22/2011 Document Reviewed: 11/27/2010 [facility] Patient Information 2013 [employer].    Domestic Abuse You are being battered or abused if someone close to you hits, pushes, or physically hurts you in any way. You also are being abused if you are forced into activities. You are being sexually abused if you are forced to have sexual contact of any kind. You are being emotionally abused if you are made to feel worthless or if you are constantly threatened. It is important to remember that help is available. No one has the right to abuse you. PREVENTION OF FURTHER  ABUSE  Learn the warning signs of danger. This varies with situations but may include: the use of alcohol, threats, isolation from friends and family, or forced sexual contact. Leave if you feel that violence is going to occur.  If you are attacked or beaten, report it to the police so the abuse is documented. You do not have to press charges. The police can protect you while you or the attackers are leaving. Get the officer's name and badge number and a copy of the report.  Find someone you can trust and tell them what is happening to you: your caregiver, a nurse, clergy member, close friend or family member. Feeling ashamed is natural, but remember that you have done nothing wrong. No one deserves abuse. Document Released:  08/27/2000 Document Revised: 11/22/2011 Document Reviewed: 11/05/2010 ExitCare Patient Information 2013 [employer].    How Much is Too Much Alcohol? Drinking too much alcohol can cause injury, accidents, and health problems. These types of problems can include:   Car crashes.  Falls.  Family fighting (domestic violence).  Drowning.  Fights.  Injuries.  Burns.  Damage to certain organs.  Having a baby with birth defects. ONE DRINK CAN BE TOO MUCH WHEN YOU ARE:  Working.  Pregnant or breastfeeding.  Taking medicines. Ask your doctor.  Driving or planning to drive. If you or someone you know has a drinking problem, get help from a doctor.  Document Released: 06/26/2009 Document Revised: 11/22/2011 Document Reviewed: 06/26/2009 ExitCare Patient Information 2013 [employer].   Smoking Hazards Smoking cigarettes is extremely bad for your health. Tobacco smoke has over 200 known poisons in it. There are over 60 chemicals in tobacco smoke that cause cancer. Some of the chemicals found in cigarette smoke include:   Cyanide.  Benzene.  Formaldehyde.  Methanol (wood alcohol).  Acetylene (fuel used in welding torches).  Ammonia. Cigarette smoke also contains the poisonous gases nitrogen oxide and carbon monoxide.  Cigarette smokers have an increased risk of many serious medical problems and Smoking causes approximately:  90% of all lung cancer deaths in men.  80% of all lung cancer deaths in women.  90% of deaths from chronic obstructive lung disease. Compared with nonsmokers, smoking increases the risk of:  Coronary heart disease by 2 to 4 times.  Stroke by 2 to 4 times.  Men developing lung cancer by 23 times.  Women developing lung cancer by 13 times.  Dying from chronic obstructive lung diseases by 12 times.  . Smoking is the most preventable cause of death and disease in our society.  WHY IS SMOKING ADDICTIVE?  Nicotine is the chemical  agent in tobacco that is capable of causing addiction or dependence.  When you smoke and inhale, nicotine is absorbed rapidly into the bloodstream through your lungs. Nicotine absorbed through the lungs is capable of creating a powerful addiction. Both inhaled and non-inhaled nicotine may be addictive.  Addiction studies of cigarettes and spit tobacco show that addiction to nicotine occurs mainly during the teen years, when young people begin using tobacco products. WHAT ARE THE BENEFITS OF QUITTING?  There are many health benefits to quitting smoking.   Likelihood of developing cancer and heart disease decreases. Health improvements are seen almost immediately.  Blood pressure, pulse rate, and breathing patterns start returning to normal soon after quitting. QUITTING SMOKING   American Lung Association - 1-800-LUNGUSA  American Cancer Society - 1-800-ACS-2345 Document Released: 10/07/2004 Document Revised: 11/22/2011 Document Reviewed: 06/11/2009 ExitCare Patient Information 2013 ExitCare,   LLC.   Stress Management Stress is a state of physical or mental tension that often results from changes in your life or normal routine. Some common causes of stress are:  Death of a loved one.  Injuries or severe illnesses.  Getting fired or changing jobs.  Moving into a new home. Other causes may be:  Sexual problems.  Business or financial losses.  Taking on a large debt.  Regular conflict with someone at home or at work.  Constant tiredness from lack of sleep. It is not just bad things that are stressful. It may be stressful to:  Win the lottery.  Get married.  Buy a new car. The amount of stress that can be easily tolerated varies from person to person. Changes generally cause stress, regardless of the types of change. Too much stress can affect your health. It may lead to physical or emotional problems. Too little stress (boredom) may also become stressful. SUGGESTIONS TO  REDUCE STRESS:  Talk things over with your family and friends. It often is helpful to share your concerns and worries. If you feel your problem is serious, you may want to get help from a professional counselor.  Consider your problems one at a time instead of lumping them all together. Trying to take care of everything at once may seem impossible. List all the things you need to do and then start with the most important one. Set a goal to accomplish 2 or 3 things each day. If you expect to do too many in a single day you will naturally fail, causing you to feel even more stressed.  Do not use alcohol or drugs to relieve stress. Although you may feel better for a short time, they do not remove the problems that caused the stress. They can also be habit forming.  Exercise regularly - at least 3 times per week. Physical exercise can help to relieve that "uptight" feeling and will relax you.  The shortest distance between despair and hope is often a good night's sleep.  Go to bed and get up on time allowing yourself time for appointments without being rushed.  Take a short "time-out" period from any stressful situation that occurs during the day. Close your eyes and take some deep breaths. Starting with the muscles in your face, tense them, hold it for a few seconds, then relax. Repeat this with the muscles in your neck, shoulders, hand, stomach, back and legs.  Take good care of yourself. Eat a balanced diet and get plenty of rest.  Schedule time for having fun. Take a break from your daily routine to relax. HOME CARE INSTRUCTIONS   Call if you feel overwhelmed by your problems and feel you can no longer manage them on your own.  Return immediately if you feel like hurting yourself or someone else. Document Released: 02/23/2001 Document Revised: 11/22/2011 Document Reviewed: 10/16/2007 ExitCare Patient Information 2013 ExitCare, LLC.  

## 2016-01-23 NOTE — Progress Notes (Signed)
Patient ID: Patricia Leach, female   DOB: 03/29/1996, 19 y.o.   MRN: [id number]  20 y.o. G0P0 Single  Caucasian Fe here for annual exam.  Menses normally at 2-3 days and very light.  She has been doing finals this past month which has increased her stressors.  She also has been traveling more. Timing of OCP has been off.  No menses with this past pill pack.  She started a new pack this past Sunday.  No change in partner and no concerns about STD's as she uses condoms each time.  She does feel that acne is some better on Yasmin.  Also on spironolactone by dermatologist.  Still no great improvement with libido. 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  Patient's last menstrual period was 12/17/2015 (exact date).          Sexually active: Yes.    The current method of family planning is OCP (estrogen/progesterone) and condoms all of the time.    Exercising: No.  The patient does not participate in regular exercise at present. Smoker:  no  Health Maintenance: Pap:  None per protocol TDaP: 2015 Gardasil: completed series 2013 HIV: 09/19/14 Labs: HB: 13.3  Urine Dip: negative Urine Pregnancy: negative   reports that she has never smoked. She has never used smokeless tobacco. She reports that she drinks about 3.6 oz of alcohol per week. She reports that she uses illicit drugs (Marijuana).  Past Medical History  Diagnosis Date  . Menorrhagia   . Dysmenorrhea   . Heart murmur of newborn     Past Surgical History  Procedure Laterality Date  . Tympanostomy tube placement    . Growth removed Right     growth removed from right ear  . Left ankle      shark attack at age 52  . Intrauterine device (iud) insertion  11/02/13    Skyla.  IUD removed 06/28/14.    Current Outpatient Prescriptions  Medication Sig Dispense Refill  . drospirenone-ethinyl estradiol (VESTURA) 3-0.02 MG tablet Take 1 tablet by mouth daily. 84 tablet 4  . spironolactone (ALDACTONE) 50 MG tablet Take 50 mg by mouth  daily.     No current facility-administered medications for this visit.    Family History  Problem Relation Age of Onset  . Adopted: Yes  . Family history unknown: Yes    ROS:  Pertinent items are noted in HPI.  Otherwise, a comprehensive ROS was negative.  Exam:   BP 100/60 mmHg  Pulse 68  Ht  (1.651 m)  Wt 136 lb (61.689 kg)  BMI 22.63 kg/m2  LMP 12/17/2015 (Exact Date) Height:  (165.1 cm) Ht Readings from Last 3 Encounters:  01/23/16  (1.651 m)  09/09/15 5' 5.5" (1.664 m)  09/19/14 5' 5.5" (1.664 m) (68 %*, Z = 0.48)   * Growth percentiles are based on CDC 2-20 Years data.    General appearance: alert, cooperative and appears stated age Head: Normocephalic, without obvious abnormality, atraumatic Neck: no adenopathy, supple, symmetrical, trachea midline and thyroid normal to inspection and palpation Lungs: clear to auscultation bilaterally Breasts: normal appearance, no masses or tenderness Heart: regular rate and rhythm Abdomen: soft, non-tender; no masses,  no organomegaly Extremities: extremities normal, atraumatic, no cyanosis or edema Skin: Skin color, texture, turgor normal. No rashes or lesions Lymph nodes: Cervical, supraclavicular, and axillary nodes normal. No abnormal inguinal nodes palpated Neurologic: Grossly normal   Pelvic: External genitalia:  no lesions  Urethra:  normal appearing urethra with no masses, tenderness or lesions              Bartholin's and Skene's: normal                 Vagina: normal appearing vagina with normal color and discharge, no lesions              Cervix: anteverted              Pap taken: No. Bimanual Exam:  Uterus:  normal size, contour, position, consistency, mobility, non-tender              Adnexa: no mass, fullness, tenderness               Rectovaginal: Confirms               Anus:  normal sphincter tone, no lesions  Chaperone present: no  A:  Well Woman with normal  exam  Contraception with STD's  History of cystic acne   P:   Reviewed health and wellness pertinent to exam  Pap smear as above  Refill on Yasmin for a year  Counseled on breast self exam, STD prevention, HIV risk factors and prevention, use and side effects of OCP's, adequate intake of calcium and vitamin D, diet and exercise return annually or prn  An After Visit Summary was printed and given to the patient.

## 2016-01-25 ENCOUNTER — Encounter: Payer: Self-pay | Admitting: Nurse Practitioner

## 2016-01-25 NOTE — Progress Notes (Signed)
Encounter reviewed by Dr. Brook Amundson C. Silva.  

## 2016-01-26 LAB — HEMOGLOBIN, FINGERSTICK: HEMOGLOBIN, FINGERSTICK: 13.3 g/dL (ref 12.0–16.0)

## 2016-09-09 ENCOUNTER — Telehealth: Payer: Self-pay | Admitting: Nurse Practitioner

## 2016-09-09 NOTE — Telephone Encounter (Addendum)
Spoke with patient. 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 Patient states he boyfriend will be here and she does not plan to be sexually active. Patient states she is stopping her acne medication prior to leaving and would like to know if it is ok to stop OCP during this 5 months? Patient states she uses OCP for birth control, not to regulate cycles. Advised patient would ultimately be her decision to stop. Advised patient Ria CommentPatricia Grubb, NP is out of the office, would review with covering provider and return call with recommendations. Patient is agreeable.  Leota Sauerseborah Leonard, CNM please advise?  Cc: Ria CommentPatricia Grubb, NP

## 2016-09-09 NOTE — Telephone Encounter (Signed)
Unable to leave message,mailbox full. 

## 2016-09-09 NOTE — Telephone Encounter (Signed)
Patient has a question about her birth control. °

## 2016-09-09 NOTE — Telephone Encounter (Signed)
Left message to call Coron Rossano at [phone].  

## 2016-09-09 NOTE — Telephone Encounter (Signed)
It is fine to stop OCP, but would need to restart one month prior to returning home for protection if plans to be sexually active. Also she may have experienced lighter periods or better cycle control with use and this will change with stopping. Just so she is aware.

## 2016-09-10 NOTE — Telephone Encounter (Signed)
Unable to leave message,mailbox full. 

## 2016-09-14 NOTE — Telephone Encounter (Signed)
Left message to call Sean Macwilliams at [phone].  

## 2016-09-20 NOTE — Telephone Encounter (Signed)
Ria CommentPatricia Grubb, NP -returned call x3 with no return call, please advise?  Leota Sauerseborah Leonard, CNM

## 2016-09-20 NOTE — Telephone Encounter (Signed)
You may send her a note of plan.  Looks like she is not leaving until end of January.

## 2016-09-20 NOTE — Telephone Encounter (Signed)
Left detailed message, ok per current dpr. Advised as seen below per Deborah Leonard, CNM. Advised to return call to office for any additional questions/concerns.   Routing to provider for final review. Patient is agreeable to disposition. Will close encounter.  

## 2017-04-01 ENCOUNTER — Other Ambulatory Visit: Payer: Self-pay | Admitting: Nurse Practitioner

## 2017-04-01 NOTE — Telephone Encounter (Signed)
Please contact patient to schedule her annual exam. I will fill one pack of pills.

## 2017-04-01 NOTE — Telephone Encounter (Signed)
Call to patient. Patient aware one pack of pills refilled for her. Patient states now is not a convenient time to schedule AEX, but she will return call at her convenience to schedule. Encounter closed.

## 2017-04-01 NOTE — Telephone Encounter (Signed)
Medication refill request: Patricia Leach  Last AEX:  01/23/16 PG Next AEX: None scheduled Last MMG (if hormonal medication request): n/a Refill authorized: 01/23/16 #[id number], 4RF. Please advise. Thank you.
